# Patient Record
Sex: Female | Born: 1990 | Race: White | Hispanic: No | Marital: Single | Smoking: Never smoker
Health system: Southern US, Community
[De-identification: ages and names within clinical notes are randomized; demographics above are authoritative.]

## PROBLEM LIST (undated history)

## (undated) DIAGNOSIS — R51 Headache: Secondary | ICD-10-CM

## (undated) DIAGNOSIS — R519 Headache, unspecified: Secondary | ICD-10-CM

## (undated) DIAGNOSIS — G932 Benign intracranial hypertension: Secondary | ICD-10-CM

## (undated) HISTORY — PX: WISDOM TOOTH EXTRACTION: SHX21

## (undated) HISTORY — DX: Headache, unspecified: R51.9

## (undated) HISTORY — DX: Benign intracranial hypertension: G93.2

## (undated) HISTORY — DX: Headache: R51

---
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
## 2019-02-02 ENCOUNTER — Telehealth: Payer: Self-pay | Admitting: Family Medicine

## 2019-02-02 NOTE — Telephone Encounter (Signed)
Patient has scheduled a new patient apt via My Chart for 02/04/19 and listed in the apt notes "Dull abdominal pain, swollen lymph node & establishing care." Patient needs to be triaged. Thank you!

## 2019-02-03 NOTE — Telephone Encounter (Signed)
LM for patient to return call for triage.  CRM placed.

## 2019-02-04 ENCOUNTER — Encounter: Payer: Self-pay | Admitting: Family Medicine

## 2019-02-04 ENCOUNTER — Ambulatory Visit (INDEPENDENT_AMBULATORY_CARE_PROVIDER_SITE_OTHER): Payer: No Typology Code available for payment source | Admitting: Family Medicine

## 2019-02-04 ENCOUNTER — Ambulatory Visit (INDEPENDENT_AMBULATORY_CARE_PROVIDER_SITE_OTHER): Payer: No Typology Code available for payment source

## 2019-02-04 ENCOUNTER — Other Ambulatory Visit: Payer: No Typology Code available for payment source

## 2019-02-04 VITALS — BP 124/70 | HR 76 | Temp 98.3°F | Ht 63.0 in | Wt 210.2 lb

## 2019-02-04 DIAGNOSIS — R1032 Left lower quadrant pain: Secondary | ICD-10-CM | POA: Diagnosis not present

## 2019-02-04 DIAGNOSIS — R599 Enlarged lymph nodes, unspecified: Secondary | ICD-10-CM

## 2019-02-04 DIAGNOSIS — D729 Disorder of white blood cells, unspecified: Secondary | ICD-10-CM

## 2019-02-04 DIAGNOSIS — G932 Benign intracranial hypertension: Secondary | ICD-10-CM

## 2019-02-04 DIAGNOSIS — R61 Generalized hyperhidrosis: Secondary | ICD-10-CM

## 2019-02-04 LAB — COMPREHENSIVE METABOLIC PANEL
ALT: 80 U/L — ABNORMAL HIGH (ref 0–35)
AST: 55 U/L — ABNORMAL HIGH (ref 0–37)
Albumin: 4.1 g/dL (ref 3.5–5.2)
Alkaline Phosphatase: 86 U/L (ref 39–117)
BILIRUBIN TOTAL: 0.4 mg/dL (ref 0.2–1.2)
BUN: 12 mg/dL (ref 6–23)
CO2: 24 mEq/L (ref 19–32)
Calcium: 9.2 mg/dL (ref 8.4–10.5)
Chloride: 104 mEq/L (ref 96–112)
Creatinine, Ser: 0.92 mg/dL (ref 0.40–1.20)
GFR: 73.79 mL/min (ref >60.00)
GLUCOSE: 72 mg/dL (ref 70–99)
Potassium: 4 mEq/L (ref 3.5–5.1)
Sodium: 138 mEq/L (ref 135–145)
TOTAL PROTEIN: 6.7 g/dL (ref 6.0–8.3)

## 2019-02-04 LAB — POCT URINE PREGNANCY: PREG TEST UR: NEGATIVE

## 2019-02-04 LAB — CBC WITH DIFFERENTIAL/PLATELET
Basophils Absolute: 0.1 10*3/uL (ref 0.0–0.1)
Basophils Relative: 0.5 % (ref 0.0–3.0)
Eosinophils Absolute: 0 10*3/uL (ref 0.0–0.7)
Eosinophils Relative: 0.3 % (ref 0.0–5.0)
HCT: 38.8 % (ref 36.0–46.0)
Hemoglobin: 12.8 g/dL (ref 12.0–15.0)
Lymphocytes Relative: 66.9 % — ABNORMAL HIGH (ref 12.0–46.0)
Lymphs Abs: 9.1 10*3/uL — ABNORMAL HIGH (ref 0.7–4.0)
MCHC: 33 g/dL (ref 30.0–36.0)
MCV: 78.2 fl (ref 78.0–100.0)
Monocytes Absolute: 1 10*3/uL (ref 0.1–1.0)
Monocytes Relative: 7.3 % (ref 3.0–12.0)
Neutro Abs: 3.4 10*3/uL (ref 1.4–7.7)
Neutrophils Relative %: 25 % — ABNORMAL LOW (ref 43.0–77.0)
PLATELETS: 320 10*3/uL (ref 150.0–400.0)
RBC: 4.96 Mil/uL (ref 3.87–5.11)
RDW: 12.9 % (ref 11.5–15.5)
WBC: 13.6 10*3/uL — ABNORMAL HIGH (ref 4.0–10.5)

## 2019-02-04 LAB — POCT URINALYSIS DIPSTICK
Bilirubin, UA: NEGATIVE
Blood, UA: NEGATIVE
Glucose, UA: NEGATIVE
Ketones, UA: NEGATIVE
Leukocytes, UA: NEGATIVE
Nitrite, UA: NEGATIVE
Protein, UA: NEGATIVE
Spec Grav, UA: >=1.03 — AB (ref 1.010–1.025)
Urobilinogen, UA: 0.2 E.U./dL
pH, UA: 6 (ref 5.0–8.0)

## 2019-02-04 LAB — TSH: TSH: 2.87 u[IU]/mL (ref 0.35–4.50)

## 2019-02-04 MED ORDER — TOPIRAMATE 100 MG PO TABS: 100.0000 mg | ORAL_TABLET | Freq: Two times a day (BID) | ORAL | 0 refills | Status: AC

## 2019-02-04 MED ORDER — ACETAZOLAMIDE 250 MG PO TABS: 250.0000 mg | ORAL_TABLET | Freq: Two times a day (BID) | ORAL | 0 refills | Status: AC

## 2019-02-04 MED FILL — acetaZOLAMIDE 250 MG TABS: 250 | 90 days supply | Qty: 180 | Fill #0

## 2019-02-04 MED FILL — TOPIRAMATE 100 MG TABLET: 100 | 90 days supply | Qty: 180 | Fill #0

## 2019-02-04 NOTE — Progress Notes (Signed)
Chief Complaint:  Katherine Wade is a 28 y.o. female who presents today with a chief complaint of abdominal pain and to establish care.   Assessment/Plan:  Left lower quadrant abdominal pain No red flags.  Benign abdominal exam.  KUB with mild to moderate stool burden based on my read-we will await radiology read.  Will start bowel regimen with MiraLAX as needed to have 1-2 soft bowel movements daily.  If symptoms do not improve the next 2 weeks, will need abdominal CT to rule out other potential etiologies.  Discussed reasons to return to care and seek emergent care.  Lymphadenopathy/night sweats Likely secondary to viral URI.  Will check CBC, CMAC, and TSH.  Continue with watchful waiting for another few weeks.  If lymphadenopathy persists for another few weeks, will obtain ultrasound of the area.  Idiopathic intracranial hypertension No red flags.  Will refill acetazolamide and Topamax today.  Will place referral to neurology.    Subjective:  HPI:  Abdominal Pain Started 2 weeks ago. Stable over that time. Worse with taking a deep breath or coughing. Nothing tried. No fevers or chills. No nausea or vomiting. No constipation or diarrhea. No obvious precipitating events. No other obvious alleviating or aggravating factors.   She has also had night sweats for the past several weeks. Soaking through clothes but not sheets. She has tried changing her thremostat and using less sheets  She has additionally had a swollen lymph node on the right side of her neck for the past week or so.  No fevers or chills.  No recent illnesses.  No clear precipitating events.  Occasionally painful to palpation.   % Idiopathic Intracranial Hypertension She has followed with neurology in the past for this She has previously been on Topamax 100 mg twice daily and acetazolamide 250 mg twice daily, however is been off all medications for the past couple of months since relocating the area.  ROS:  Per HPI, otherwise a complete review of systems was negative.   PMH:  The following were reviewed and entered/updated in epic: Past Medical History:  Diagnosis Date  . Frequent headaches   . Pseudotumor cerebri    Patient Active Problem List   Diagnosis Date Noted  . Idiopathic intracranial hypertension 02/04/2019   Past Surgical History:  Procedure Laterality Date  . Left Thumb Tendon Reattachment  2012  . WISDOM TOOTH EXTRACTION      Family History  Problem Relation Age of Onset  . Diabetes Mother   . Diabetes Father   . Early death Father   . Heart attack Father   . Alcohol abuse Brother   . Depression Brother   . Arthritis Maternal Grandmother   . Cancer Maternal Grandmother        Breast  . COPD Maternal Grandmother   . Diabetes Maternal Grandmother   . Hyperlipidemia Maternal Grandmother   . Cancer Maternal Grandfather        Prostate  . Diabetes Maternal Grandfather   . Kidney disease Maternal Grandfather   . Depression Paternal Grandmother   . Cancer Paternal Grandfather   . COPD Paternal Grandfather   . Mental illness Paternal Grandfather   . Colon cancer Neg Hx     Medications- reviewed and updated Current Outpatient Medications  Medication Sig Dispense Refill  . Multiple Vitamin (MULTIVITAMIN) tablet Take 1 tablet by mouth daily.    Marland Kitchen acetaZOLAMIDE (DIAMOX) 250 MG tablet Take 1 tablet (250 mg total) by mouth 2 (two) times daily.  180 tablet 0  . topiramate (TOPAMAX) 100 MG tablet Take 1 tablet (100 mg total) by mouth 2 (two) times daily. 180 tablet 0   No current facility-administered medications for this visit.     Allergies-reviewed and updated No Known Allergies  Social History   Socioeconomic History  . Marital status: Not on file    Spouse name: Not on file  . Number of children: Not on file  . Years of education: Not on file  . Highest education level: Not on file  Occupational History  . Not on file  Social Needs  . Financial  resource strain: Not on file  . Food insecurity:    Worry: Not on file    Inability: Not on file  . Transportation needs:    Medical: Not on file    Non-medical: Not on file  Tobacco Use  . Smoking status: Never Smoker  . Smokeless tobacco: Never Used  Substance and Sexual Activity  . Alcohol use: Yes  . Drug use: Never  . Sexual activity: Not on file  Lifestyle  . Physical activity:    Days per week: Not on file    Minutes per session: Not on file  . Stress: Not on file  Relationships  . Social connections:    Talks on phone: Not on file    Gets together: Not on file    Attends religious service: Not on file    Active member of club or organization: Not on file    Attends meetings of clubs or organizations: Not on file    Relationship status: Not on file  Other Topics Concern  . Not on file  Social History Narrative  . Not on file         Objective:  Physical Exam: BP 124/70 (BP Location: Left Arm, Patient Position: Sitting, Cuff Size: Normal)   Pulse 76   Temp 98.3 F (36.8 C) (Oral)   Ht 5\' 3"  (1.6 m)   Wt 210 lb 3.2 oz (95.3 kg)   LMP 01/16/2019   SpO2 98%   BMI 37.24 kg/m   Gen: NAD, resting comfortably HEENT: TMs clear.  OP slightly erythematous with mild tonsillar hypertrophy.  Tender nodule noted just anterior and medial to right mandibular angle. CV: Regular rate and rhythm with no murmurs appreciated Pulm: Normal work of breathing, clear to auscultation bilaterally with no crackles, wheezes, or rhonchi GI: Normal bowel sounds present. Soft, mildly tender to palpation in left lower quadrant, Nondistended.  No rebound or guarding. MSK: No edema, cyanosis, or clubbing noted Skin: Warm, dry Neuro: Grossly normal, moves all extremities Psych: Normal affect and thought content  Results for orders placed or performed in visit on 02/04/19 (from the past 24 hour(s))  POCT urinalysis dipstick     Status: Abnormal   Collection Time: 02/04/19 10:12 AM  Result  Value Ref Range   Color, UA yellow    Clarity, UA clear    Glucose, UA Negative Negative   Bilirubin, UA Negative    Ketones, UA Negative    Spec Grav, UA >=1.030 (A) 1.010 - 1.025   Blood, UA Negative    pH, UA 6.0 5.0 - 8.0   Protein, UA Negative Negative   Urobilinogen, UA 0.2 0.2 or 1.0 E.U./dL   Nitrite, UA Negative    Leukocytes, UA Negative Negative   Appearance     Odor    POCT urine pregnancy     Status: None   Collection Time: 02/04/19  10:13 AM  Result Value Ref Range   Preg Test, Ur Negative Negative        Danette Weinfeld M. Jimmey RalphParker, MD 02/04/2019 11:58 AM

## 2019-02-04 NOTE — Telephone Encounter (Signed)
Patient is in the office now.

## 2019-02-04 NOTE — Assessment & Plan Note (Signed)
No red flags.  Will refill acetazolamide and Topamax today.  Will place referral to neurology.

## 2019-02-04 NOTE — Patient Instructions (Signed)
It was very nice to see you today!  I think you are having some constipation that could be causing your symptoms.  Please take MiraLAX as needed to have 1-2 soft movements per day.  Please make sure that you are getting plenty of fluids and eating a diet high in fiber.  Let me know if your symptoms do not improve in the next 1 to 2 weeks and we will get a CT scan of the area.  We will check blood work today to make sure there is nothing else going on.  The lymph node in your neck is likely a reactive lymph node to a mild viral infection.  We will check blood work today.  Please let me know if it is not improved over the next couple weeks and we can get an ultrasound of the area.  I will refill your Topamax and acetazolamide today.  I will also place referral to neurology.  Take care, Dr Jimmey Ralph

## 2019-02-05 LAB — PATHOLOGIST SMEAR REVIEW

## 2019-02-08 ENCOUNTER — Other Ambulatory Visit: Payer: Self-pay | Admitting: Family Medicine

## 2019-02-08 DIAGNOSIS — R945 Abnormal results of liver function studies: Secondary | ICD-10-CM

## 2019-02-08 DIAGNOSIS — R1032 Left lower quadrant pain: Secondary | ICD-10-CM

## 2019-02-08 DIAGNOSIS — R7989 Other specified abnormal findings of blood chemistry: Secondary | ICD-10-CM

## 2019-02-08 NOTE — Progress Notes (Signed)
Please inform patient of the following:  Her liver tests and blood counts were off just a little bit. The pathologist thought that these are most likely a reactive process - nothing to worry about. I would like for her to come back in 2 weeks to recheck.  Please place future order for CMET, CBC with differential, and peripheral smear.  Katina Degree. Jimmey Ralph, MD 02/08/2019 8:51 AM

## 2019-02-08 NOTE — Progress Notes (Signed)
Please inform patient of the following:  Radiology reviewed her xray and agreed with moderate stool burden. Did not see any other abnormalities. Would like for her to proceed with bowel regimen as we discussed.  Katina Degree. Jimmey Ralph, MD 02/08/2019 8:55 AM

## 2019-02-09 ENCOUNTER — Encounter: Payer: Self-pay | Admitting: Family Medicine

## 2019-02-09 ENCOUNTER — Encounter: Payer: Self-pay | Admitting: Neurology

## 2019-02-09 ENCOUNTER — Other Ambulatory Visit: Payer: Self-pay

## 2019-02-09 DIAGNOSIS — F4321 Adjustment disorder with depressed mood: Secondary | ICD-10-CM

## 2019-02-09 DIAGNOSIS — G932 Benign intracranial hypertension: Secondary | ICD-10-CM

## 2019-02-22 NOTE — Progress Notes (Deleted)
NEUROLOGY CONSULTATION NOTE  Katherine Wade MRN: [id number] DOB: [birth year]  Referring provider: Jacquiline Doe, MD Primary care provider: Jacquiline Doe, MD  Reason for consult:  Idiopathic intracranial hypertension  HISTORY OF PRESENT ILLNESS: Katherine Wade is a 28 year old ***-handed Caucasian woman who presents for idiopathic intracranial hypertension.  History supplemented by referring provider's note.  ***.  She had been on topiramate but was off of it for a couple of months after relocating to [place] from *** back in December 2019.  She was restarted on topiramate last month.    CMP from 02/04/19 demonstrated elevated AST 55 and ALT 80 with normal ALP and t bili, thought to be a reactive process.  PAST MEDICAL HISTORY: Past Medical History:  Diagnosis Date  . Frequent headaches   . Pseudotumor cerebri     PAST SURGICAL HISTORY: Past Surgical History:  Procedure Laterality Date  . Left Thumb Tendon Reattachment  2012  . WISDOM TOOTH EXTRACTION      MEDICATIONS: Current Outpatient Medications on File Prior to Visit  Medication Sig Dispense Refill  . acetaZOLAMIDE (DIAMOX) 250 MG tablet Take 1 tablet (250 mg total) by mouth 2 (two) times daily. 180 tablet 0  . Multiple Vitamin (MULTIVITAMIN) tablet Take 1 tablet by mouth daily.    Marland Kitchen topiramate (TOPAMAX) 100 MG tablet Take 1 tablet (100 mg total) by mouth 2 (two) times daily. 180 tablet 0   No current facility-administered medications on file prior to visit.     ALLERGIES: No Known Allergies  FAMILY HISTORY: Family History  Problem Relation Age of Onset  . Diabetes Mother   . Diabetes Father   . Early death Father   . Heart attack Father   . Alcohol abuse Brother   . Depression Brother   . Arthritis Maternal Grandmother   . Cancer Maternal Grandmother        Breast  . COPD Maternal Grandmother   . Diabetes Maternal Grandmother   . Hyperlipidemia Maternal Grandmother   . Cancer Maternal  Grandfather        Prostate  . Diabetes Maternal Grandfather   . Kidney disease Maternal Grandfather   . Depression Paternal Grandmother   . Cancer Paternal Grandfather   . COPD Paternal Grandfather   . Mental illness Paternal Grandfather   . Colon cancer Neg Hx    ***.  SOCIAL HISTORY: Social History   Socioeconomic History  . Marital status: Single    Spouse name: Not on file  . Number of children: Not on file  . Years of education: Not on file  . Highest education level: Not on file  Occupational History  . Not on file  Social Needs  . Financial resource strain: Not on file  . Food insecurity:    Worry: Not on file    Inability: Not on file  . Transportation needs:    Medical: Not on file    Non-medical: Not on file  Tobacco Use  . Smoking status: Never Smoker  . Smokeless tobacco: Never Used  Substance and Sexual Activity  . Alcohol use: Yes  . Drug use: Never  . Sexual activity: Not on file  Lifestyle  . Physical activity:    Days per week: Not on file    Minutes per session: Not on file  . Stress: Not on file  Relationships  . Social connections:    Talks on phone: Not on file    Gets together: Not on file  Attends religious service: Not on file    Active member of club or organization: Not on file    Attends meetings of clubs or organizations: Not on file    Relationship status: Not on file  . Intimate partner violence:    Fear of current or ex partner: Not on file    Emotionally abused: Not on file    Physically abused: Not on file    Forced sexual activity: Not on file  Other Topics Concern  . Not on file  Social History Narrative  . Not on file    REVIEW OF SYSTEMS: Constitutional: No fevers, chills, or sweats, no generalized fatigue, change in appetite Eyes: No visual changes, double vision, eye pain Ear, nose and throat: No hearing loss, ear pain, nasal congestion, sore throat Cardiovascular: No chest pain, palpitations Respiratory:  No  shortness of breath at rest or with exertion, wheezes GastrointestinaI: No nausea, vomiting, diarrhea, abdominal pain, fecal incontinence Genitourinary:  No dysuria, urinary retention or frequency Musculoskeletal:  No neck pain, back pain Integumentary: No rash, pruritus, skin lesions Neurological: as above Psychiatric: No depression, insomnia, anxiety Endocrine: No palpitations, fatigue, diaphoresis, mood swings, change in appetite, change in weight, increased thirst Hematologic/Lymphatic:  No purpura, petechiae. Allergic/Immunologic: no itchy/runny eyes, nasal congestion, recent allergic reactions, rashes  PHYSICAL EXAM: *** General: No acute distress.  Patient appears ***-groomed.  *** Head:  Normocephalic/atraumatic Eyes:  fundi examined but not visualized Neck: supple, no paraspinal tenderness, full range of motion Back: No paraspinal tenderness Heart: regular rate and rhythm Lungs: Clear to auscultation bilaterally. Vascular: No carotid bruits. Neurological Exam: Mental status: alert and oriented to person, place, and time, recent and remote memory intact, fund of knowledge intact, attention and concentration intact, speech fluent and not dysarthric, language intact. Cranial nerves: CN I: not tested CN II: pupils equal, round and reactive to light, visual fields intact CN III, IV, VI:  full range of motion, no nystagmus, no ptosis CN V: facial sensation intact CN VII: upper and lower face symmetric CN VIII: hearing intact CN IX, X: gag intact, uvula midline CN XI: sternocleidomastoid and trapezius muscles intact CN XII: tongue midline Bulk & Tone: normal, no fasciculations. Motor:  5/5 throughout *** Sensation:  Pinprick *** temperature *** and vibration sensation intact.  ***. Deep Tendon Reflexes:  2+ throughout, *** toes downgoing.  *** Finger to nose testing:  Without dysmetria.  *** Heel to shin:  Without dysmetria.  *** Gait:  Normal station and stride.  Able to  turn and tandem walk. Romberg ***.  IMPRESSION: ***  PLAN: ***  Thank you for allowing me to take part in the care of this patient.  Shon Millet, DO  CC: ***

## 2019-02-23 ENCOUNTER — Ambulatory Visit: Payer: No Typology Code available for payment source | Admitting: Neurology

## 2019-02-24 ENCOUNTER — Other Ambulatory Visit: Payer: Self-pay

## 2019-02-24 ENCOUNTER — Other Ambulatory Visit (INDEPENDENT_AMBULATORY_CARE_PROVIDER_SITE_OTHER): Payer: No Typology Code available for payment source

## 2019-02-24 ENCOUNTER — Other Ambulatory Visit: Payer: No Typology Code available for payment source

## 2019-02-24 DIAGNOSIS — R7989 Other specified abnormal findings of blood chemistry: Secondary | ICD-10-CM

## 2019-02-24 DIAGNOSIS — R945 Abnormal results of liver function studies: Secondary | ICD-10-CM

## 2019-02-24 DIAGNOSIS — R1032 Left lower quadrant pain: Secondary | ICD-10-CM

## 2019-02-24 NOTE — Addendum Note (Signed)
Addended by: WARREN-COBBS, Fusaye Wachtel T on: 02/24/2019 02:31 PM   Modules accepted: Orders  

## 2019-02-24 NOTE — Addendum Note (Signed)
Addended by: Young Berry T on: 02/24/2019 02:31 PM   Modules accepted: Orders

## 2019-02-25 LAB — COMPREHENSIVE METABOLIC PANEL
AG Ratio: 1.6 (calc) (ref 1.0–2.5)
ALT: 23 U/L (ref 6–29)
AST: 19 U/L (ref 10–30)
Albumin: 4.1 g/dL (ref 3.6–5.1)
Alkaline phosphatase (APISO): 70 U/L (ref 31–125)
BUN: 16 mg/dL (ref 7–25)
CO2: 19 mmol/L — ABNORMAL LOW (ref 20–32)
Calcium: 9 mg/dL (ref 8.6–10.2)
Chloride: 113 mmol/L — ABNORMAL HIGH (ref 98–110)
Creat: 1.03 mg/dL (ref 0.50–1.10)
GLUCOSE: 77 mg/dL (ref 65–99)
Globulin: 2.6 g/dL (calc) (ref 1.9–3.7)
Potassium: 3.9 mmol/L (ref 3.5–5.3)
Sodium: 138 mmol/L (ref 135–146)
Total Bilirubin: 0.3 mg/dL (ref 0.2–1.2)
Total Protein: 6.7 g/dL (ref 6.1–8.1)

## 2019-02-25 LAB — CBC WITH DIFFERENTIAL/PLATELET
ABSOLUTE MONOCYTES: 608 {cells}/uL (ref 200–950)
BASOS PCT: 1 %
Basophils Absolute: 81 cells/uL (ref 0–200)
EOS ABS: 57 {cells}/uL (ref 15–500)
Eosinophils Relative: 0.7 %
HCT: 39 % (ref 35.0–45.0)
Hemoglobin: 13 g/dL (ref 11.7–15.5)
Lymphs Abs: 3775 cells/uL (ref 850–3900)
MCH: 25.8 pg — AB (ref 27.0–33.0)
MCHC: 33.3 g/dL (ref 32.0–36.0)
MCV: 77.4 fL — ABNORMAL LOW (ref 80.0–100.0)
MPV: 10.9 fL (ref 7.5–12.5)
Monocytes Relative: 7.5 %
Neutro Abs: 3580 cells/uL (ref 1500–7800)
Neutrophils Relative %: 44.2 %
Platelets: 353 10*3/uL (ref 140–400)
RBC: 5.04 10*6/uL (ref 3.80–5.10)
RDW: 13.6 % (ref 11.0–15.0)
Total Lymphocyte: 46.6 %
WBC: 8.1 10*3/uL (ref 3.8–10.8)

## 2019-02-25 LAB — PATHOLOGIST SMEAR REVIEW

## 2019-02-25 NOTE — Progress Notes (Signed)
Please inform patient of the following:  Blood counts and liver tests have normalized. If her symptoms have resolved do not need to do any further testing. Would like for her to come in for OV if still having symptoms.  Katherine Wade. Jimmey Ralph, MD 02/25/2019 9:02 PM

## 2019-03-02 ENCOUNTER — Ambulatory Visit: Payer: No Typology Code available for payment source | Admitting: Psychology

## 2019-03-02 ENCOUNTER — Other Ambulatory Visit: Payer: Self-pay

## 2019-03-02 DIAGNOSIS — F4321 Adjustment disorder with depressed mood: Secondary | ICD-10-CM

## 2019-03-16 ENCOUNTER — Ambulatory Visit (INDEPENDENT_AMBULATORY_CARE_PROVIDER_SITE_OTHER): Payer: No Typology Code available for payment source | Admitting: Psychology

## 2019-03-16 DIAGNOSIS — F4321 Adjustment disorder with depressed mood: Secondary | ICD-10-CM

## 2019-03-30 ENCOUNTER — Ambulatory Visit (INDEPENDENT_AMBULATORY_CARE_PROVIDER_SITE_OTHER): Payer: No Typology Code available for payment source | Admitting: Psychology

## 2019-03-30 DIAGNOSIS — F4321 Adjustment disorder with depressed mood: Secondary | ICD-10-CM

## 2019-04-13 ENCOUNTER — Ambulatory Visit (INDEPENDENT_AMBULATORY_CARE_PROVIDER_SITE_OTHER): Payer: No Typology Code available for payment source | Admitting: Psychology

## 2019-04-13 DIAGNOSIS — F4321 Adjustment disorder with depressed mood: Secondary | ICD-10-CM

## 2019-04-27 ENCOUNTER — Ambulatory Visit (INDEPENDENT_AMBULATORY_CARE_PROVIDER_SITE_OTHER): Payer: No Typology Code available for payment source | Admitting: Psychology

## 2019-04-27 DIAGNOSIS — F4321 Adjustment disorder with depressed mood: Secondary | ICD-10-CM | POA: Diagnosis not present

## 2019-05-06 ENCOUNTER — Ambulatory Visit: Payer: No Typology Code available for payment source | Admitting: Neurology

## 2019-05-11 ENCOUNTER — Encounter: Payer: Self-pay | Admitting: *Deleted

## 2019-05-11 ENCOUNTER — Ambulatory Visit (INDEPENDENT_AMBULATORY_CARE_PROVIDER_SITE_OTHER): Payer: No Typology Code available for payment source | Admitting: Psychology

## 2019-05-11 DIAGNOSIS — F4321 Adjustment disorder with depressed mood: Secondary | ICD-10-CM | POA: Diagnosis not present

## 2019-05-11 NOTE — Progress Notes (Addendum)
Virtual Visit via Video Note The purpose of this virtual visit is to provide medical care while limiting exposure to the novel coronavirus.    Consent was obtained for video visit:  Yes.   Answered questions that patient had about telehealth interaction:  Yes.   I discussed the limitations, risks, security and privacy concerns of performing an evaluation and management service by telemedicine. I also discussed with the patient that there may be a patient responsible charge related to this service. The patient expressed understanding and agreed to proceed.  [name], MD I connected with Katherine Wade at patients initiation/request on 05/12/2019 at 10:30 AM EDT by video enabled telemedicine application and verified that I am speaking with the correct person using two identifiers. Pt MRN:  [id number] Pt DOB:  November 29, 1991 Video Participants:  Katherine Wade   History of Present Illness:  Katherine Wade is a 28 year old woman who presents for idiopathic intracranial hypertension.  History supplemented by referring provider note.  She moved to [place] in January and is establishing care with a neurologist.  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  She had pulsatile tinnitus.  She also reported worsening headaches radiating into the back of her neck.  They were sever and aggravated by exercise which caused dizziness and blurred vision.  No associated nausea, photophobia or phonophobia.  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  She was sent to a neurologist.  As per report of MRI/MRV of head from 02/03/18, there was narrowing of the transverse-sigmoid junction on the right measuring 62% with 13 cm H2O pressure gradient.  She was started on low-dose acetazolamide.  She had an LP which demonstrated an opening pressure of 26 cm water.  Subsequently the acetazolamide was increased and  was started on topiramate to help with the headaches.  Headaches are now not as frequent or severe.  Headaches now occur 1 to 2 times a week, lasting until she takes ibuprofen.  She has headaches since high school.  She still has intermittent pulsatile tinnitus.  She denies visual obscurations.    She currently has an eye exam scheduled with ophthalmology, Dr. Wynell Balloon tomorrow.  Her last eye exam was at least 6 months ago, which demonstrated some residual findings of papilledema but not active.  She currently takes topiramate 100mg  twice daily and acetazolamide 250mg  twice daily.  02/24/19 CMP: NA 138, K3.9, CL 113, CO2 19, glucose 77, BUN 16, CR 1.03, T bili 0.3, ALP 70, AST 19, ALT 23.  Past Medical History: Past Medical History:  Diagnosis Date  . Frequent headaches   . Pseudotumor cerebri     Medications: Outpatient Encounter Medications as of 05/12/2019  Medication Sig  . acetaZOLAMIDE (DIAMOX) 250 MG tablet Take 1 tablet (250 mg total) by mouth 2 (two) times daily.  . Multiple Vitamin (MULTIVITAMIN) tablet Take 1 tablet by mouth daily.  Marland Kitchen topiramate (TOPAMAX) 100 MG tablet Take 1 tablet (100 mg total) by mouth 2 (two) times daily.   No facility-administered encounter medications on file as of 05/12/2019.     Allergies: No Known Allergies  Family History: Family History  Problem Relation Age of Onset  . Diabetes Mother   . Diabetes Father   . Early death Father   . Heart attack Father   . Alcohol abuse Brother   . Depression Brother   . Arthritis Maternal Grandmother   .  Cancer Maternal Grandmother        Breast  . COPD Maternal Grandmother   . Diabetes Maternal Grandmother   . Hyperlipidemia Maternal Grandmother   . Cancer Maternal Grandfather        Prostate  . Diabetes Maternal Grandfather   . Kidney disease Maternal Grandfather   . Depression Paternal Grandmother   . Cancer Paternal Grandfather   . COPD Paternal Grandfather   . Mental illness  Paternal Grandfather   . Colon cancer Neg Hx     Social History: Social History   Socioeconomic History  . Marital status: Single    Spouse name: Not on file  . Number of children: Not on file  . Years of education: Not on file  . Highest education level: Not on file  Occupational History  . Not on file  Social Needs  . Financial resource strain: Not on file  . Food insecurity:    Worry: Not on file    Inability: Not on file  . Transportation needs:    Medical: Not on file    Non-medical: Not on file  Tobacco Use  . Smoking status: Never Smoker  . Smokeless tobacco: Never Used  Substance and Sexual Activity  . Alcohol use: Yes  . Drug use: Never  . Sexual activity: Not on file  Lifestyle  . Physical activity:    Days per week: Not on file    Minutes per session: Not on file  . Stress: Not on file  Relationships  . Social connections:    Talks on phone: Not on file    Gets together: Not on file    Attends religious service: Not on file    Active member of club or organization: Not on file    Attends meetings of clubs or organizations: Not on file    Relationship status: Not on file  . Intimate partner violence:    Fear of current or ex partner: Not on file    Emotionally abused: Not on file    Physically abused: Not on file    Forced sexual activity: Not on file  Other Topics Concern  . Not on file  Social History Narrative  . Not on file   Observations/Objective:   Height 5\' 3"  (1.6 m), weight 210 lb (95.3 kg). No acute distress.  Alert and oriented.  Speech fluent and not dysarthric.  Language intact.  Face symmetric.  Assessment and Plan:   1. Idiopathic intracranial hypertension 2.  Headaches, likely migraine vs tension type  Follow Up Instructions:  1.  Acetazolamide 250mg  twice daily and topiramate 100mg  twice daily.    2.  Follow up with ophthalmology tomorrow.  Will increase acetazolamide if needed.  ADDENDUM:  Eye exam from 05/13/19 demonstrated  no disc edema  3.  Limit use of pain relievers to no more than 2 days out of week to prevent risk of rebound or medication-overuse headache.  4.  Encouraged weight loss/exercise/diet  5.  Try to obtain records from previous neurologist.  6.  Follow up in 6 months.    -I discussed the assessment and treatment plan with the patient. The patient was provided an opportunity to ask questions and all were answered. The patient agreed with the plan and demonstrated an understanding of the instructions.   The patient was advised to call back or seek an in-person evaluation if the symptoms worsen or if the condition fails to improve as anticipated.   Cira ServantAdam Robert , DO

## 2019-05-12 ENCOUNTER — Encounter: Payer: Self-pay | Admitting: *Deleted

## 2019-05-12 ENCOUNTER — Encounter: Payer: Self-pay | Admitting: Neurology

## 2019-05-12 ENCOUNTER — Other Ambulatory Visit: Payer: Self-pay

## 2019-05-12 ENCOUNTER — Telehealth (INDEPENDENT_AMBULATORY_CARE_PROVIDER_SITE_OTHER): Payer: No Typology Code available for payment source | Admitting: Neurology

## 2019-05-12 VITALS — Ht 63.0 in | Wt 210.0 lb

## 2019-05-12 DIAGNOSIS — G932 Benign intracranial hypertension: Secondary | ICD-10-CM | POA: Diagnosis not present

## 2019-05-12 DIAGNOSIS — R519 Headache, unspecified: Secondary | ICD-10-CM

## 2019-05-25 ENCOUNTER — Ambulatory Visit: Payer: No Typology Code available for payment source | Admitting: Psychology

## 2019-05-28 ENCOUNTER — Ambulatory Visit (INDEPENDENT_AMBULATORY_CARE_PROVIDER_SITE_OTHER): Payer: No Typology Code available for payment source | Admitting: Psychology

## 2019-05-28 DIAGNOSIS — F4321 Adjustment disorder with depressed mood: Secondary | ICD-10-CM | POA: Diagnosis not present

## 2019-06-11 ENCOUNTER — Ambulatory Visit (INDEPENDENT_AMBULATORY_CARE_PROVIDER_SITE_OTHER): Payer: No Typology Code available for payment source | Admitting: Psychology

## 2019-06-11 DIAGNOSIS — F4321 Adjustment disorder with depressed mood: Secondary | ICD-10-CM

## 2019-06-15 ENCOUNTER — Encounter: Payer: Self-pay | Admitting: Family Medicine

## 2019-06-28 ENCOUNTER — Ambulatory Visit: Payer: Self-pay | Admitting: Psychology

## 2019-06-29 ENCOUNTER — Encounter: Payer: Self-pay | Admitting: Family Medicine

## 2019-06-29 ENCOUNTER — Ambulatory Visit (INDEPENDENT_AMBULATORY_CARE_PROVIDER_SITE_OTHER): Payer: No Typology Code available for payment source | Admitting: Family Medicine

## 2019-06-29 ENCOUNTER — Other Ambulatory Visit: Payer: Self-pay

## 2019-06-29 VITALS — BP 118/76 | HR 61 | Temp 98.5°F | Ht 63.0 in | Wt 209.8 lb

## 2019-06-29 DIAGNOSIS — Z13220 Encounter for screening for lipoid disorders: Secondary | ICD-10-CM

## 2019-06-29 DIAGNOSIS — K219 Gastro-esophageal reflux disease without esophagitis: Secondary | ICD-10-CM

## 2019-06-29 DIAGNOSIS — G932 Benign intracranial hypertension: Secondary | ICD-10-CM | POA: Diagnosis not present

## 2019-06-29 DIAGNOSIS — Z131 Encounter for screening for diabetes mellitus: Secondary | ICD-10-CM

## 2019-06-29 DIAGNOSIS — Z0001 Encounter for general adult medical examination with abnormal findings: Secondary | ICD-10-CM | POA: Diagnosis not present

## 2019-06-29 DIAGNOSIS — Z6837 Body mass index (BMI) 37.0-37.9, adult: Secondary | ICD-10-CM

## 2019-06-29 DIAGNOSIS — E669 Obesity, unspecified: Secondary | ICD-10-CM

## 2019-06-29 DIAGNOSIS — E7800 Pure hypercholesterolemia, unspecified: Secondary | ICD-10-CM | POA: Insufficient documentation

## 2019-06-29 LAB — LIPID PANEL
Cholesterol: 173 mg/dL (ref 0–200)
HDL: 40.3 mg/dL (ref >39.00)
LDL Cholesterol: 122 mg/dL — ABNORMAL HIGH (ref 0–99)
NonHDL: 133.17
Total CHOL/HDL Ratio: 4
Triglycerides: 55 mg/dL (ref 0.0–149.0)
VLDL: 11 mg/dL (ref 0.0–40.0)

## 2019-06-29 LAB — GLUCOSE, RANDOM: Glucose, Bld: 94 mg/dL (ref 70–99)

## 2019-06-29 NOTE — Progress Notes (Signed)
Chief Complaint:  Katherine StarksKatelind Scarlett Wade is a 28 y.o. female who presents today for her annual comprehensive physical exam.    Assessment/Plan:  Idiopathic intracranial hypertension Stable.  Continue acetazolamide and Topamax per neurology.  GERD (gastroesophageal reflux disease) No red flags.  Recommended Pepcid or Tagamet as needed.   Body mass index is 37.16 kg/m. / Obesity BMI Metric Follow Up - 06/29/19 1327      BMI Metric Follow Up-Please document annually   BMI Metric Follow Up  Education provided        Preventative Healthcare: Check lipid panel and random glucose.  Patient Counseling(The following topics were reviewed and/or handout was given):  -Nutrition: Stressed importance of moderation in sodium/caffeine intake, saturated fat and cholesterol, caloric balance, sufficient intake of fresh fruits, vegetables, and fiber.  -Stressed the importance of regular exercise.   -Substance Abuse: Discussed cessation/primary prevention of tobacco, alcohol, or other drug use; driving or other dangerous activities under the influence; availability of treatment for abuse.   -Injury prevention: Discussed safety belts, safety helmets, smoke detector, smoking near bedding or upholstery.   -Sexuality: Discussed sexually transmitted diseases, partner selection, use of condoms, avoidance of unintended pregnancy and contraceptive alternatives.   -Dental health: Discussed importance of regular tooth brushing, flossing, and dental visits.  -Health maintenance and immunizations reviewed. Please refer to Health maintenance section.  Return to care in 1 year for next preventative visit.     Subjective:  HPI:  She has no acute complaints today.   She has had some issues with reflux for the past several months. Nothing tried. Worse with night shift. No other obvious alleviating or aggravating factors.   Her stable, chronic medical conditions are outlined below:   % Idiopathic  Intracranial Hypertension - Follows with neurology - On Topamax 100 mg twice daily and acetazolamide 250 mg twice daily  Lifestyle Diet: Tries to eat a healthy, balanced diet.  Exercise: Works on cardio 20 minutes per day. Also works on weight lifting.   Depression screen PHQ 2/9 02/04/2019  Decreased Interest 0  Down, Depressed, Hopeless 0  PHQ - 2 Score 0   Health Maintenance Due  Topic Date Due  . HIV Screening  02/07/2008    ROS: Per HPI, otherwise a complete review of systems was negative.   PMH:  The following were reviewed and entered/updated in epic: Past Medical History:  Diagnosis Date  . Frequent headaches   . Pseudotumor cerebri    Patient Active Problem List   Diagnosis Date Noted  . GERD (gastroesophageal reflux disease) 06/29/2019  . Idiopathic intracranial hypertension 02/04/2019   Past Surgical History:  Procedure Laterality Date  . Left Thumb Tendon Reattachment  2012  . WISDOM TOOTH EXTRACTION      Family History  Problem Relation Age of Onset  . Diabetes Mother   . Diabetes Father   . Early death Father   . Heart attack Father   . Alcohol abuse Brother   . Depression Brother   . Arthritis Maternal Grandmother   . Cancer Maternal Grandmother        Breast  . COPD Maternal Grandmother   . Diabetes Maternal Grandmother   . Hyperlipidemia Maternal Grandmother   . Cancer Maternal Grandfather        Prostate  . Diabetes Maternal Grandfather   . Kidney disease Maternal Grandfather   . Depression Paternal Grandmother   . Cancer Paternal Grandfather   . COPD Paternal Grandfather   . Mental illness Paternal  Grandfather   . Colon cancer Neg Hx     Medications- reviewed and updated Current Outpatient Medications  Medication Sig Dispense Refill  . acetaZOLAMIDE (DIAMOX) 250 MG tablet Take 1 tablet (250 mg total) by mouth 2 (two) times daily. 180 tablet 0  . Multiple Vitamin (MULTIVITAMIN) tablet Take 1 tablet by mouth daily.    Marland Kitchen topiramate  (TOPAMAX) 100 MG tablet Take 1 tablet (100 mg total) by mouth 2 (two) times daily. 180 tablet 0   No current facility-administered medications for this visit.     Allergies-reviewed and updated No Known Allergies  Social History   Socioeconomic History  . Marital status: Single    Spouse name: Not on file  . Number of children: Not on file  . Years of education: Not on file  . Highest education level: Not on file  Occupational History  . Not on file  Social Needs  . Financial resource strain: Not on file  . Food insecurity    Worry: Not on file    Inability: Not on file  . Transportation needs    Medical: Not on file    Non-medical: Not on file  Tobacco Use  . Smoking status: Never Smoker  . Smokeless tobacco: Never Used  Substance and Sexual Activity  . Alcohol use: Never    Frequency: Never  . Drug use: Never  . Sexual activity: Not on file  Lifestyle  . Physical activity    Days per week: Not on file    Minutes per session: Not on file  . Stress: Not on file  Relationships  . Social Herbalist on phone: Not on file    Gets together: Not on file    Attends religious service: Not on file    Active member of club or organization: Not on file    Attends meetings of clubs or organizations: Not on file    Relationship status: Not on file  Other Topics Concern  . Not on file  Social History Narrative   Pt is Left handed        Objective:  Physical Exam: BP 118/76 (BP Location: Left Arm, Patient Position: Sitting, Cuff Size: Normal)   Pulse 61   Temp 98.5 F (36.9 C) (Oral)   Ht 5\' 3"  (1.6 m)   Wt 209 lb 12.8 oz (95.2 kg)   SpO2 99%   BMI 37.16 kg/m   Body mass index is 37.16 kg/m. Wt Readings from Last 3 Encounters:  06/29/19 209 lb 12.8 oz (95.2 kg)  05/12/19 210 lb (95.3 kg)  05/12/19 200 lb (90.7 kg)   Gen: NAD, resting comfortably HEENT: TMs normal bilaterally. OP clear. No thyromegaly noted.  CV: RRR with no murmurs appreciated  Pulm: NWOB, CTAB with no crackles, wheezes, or rhonchi GI: Normal bowel sounds present. Soft, Nontender, Nondistended. MSK: no edema, cyanosis, or clubbing noted Skin: warm, dry Neuro: CN2-12 grossly intact. Strength 5/5 in upper and lower extremities. Reflexes symmetric and intact bilaterally.  Psych: Normal affect and thought content     Pailyn Bellevue M. [name], MD 06/29/2019 1:28 PM

## 2019-06-29 NOTE — Progress Notes (Signed)
Please inform patient of the following:  Her bad cholesterol is a bit high but everything else is normal.  Do not restart meds.  Continue working on diet and exercise and we can recheck in a few years.  Katherine Wade. [name], MD 06/29/2019 5:06 PM

## 2019-06-29 NOTE — Assessment & Plan Note (Signed)
Stable.  Continue acetazolamide and Topamax per neurology.

## 2019-06-29 NOTE — Patient Instructions (Signed)
It was very nice to see you today!  You can try taking pepcid or tagamet for your reflux.   We will check blood work today.   Come back in 1 year for your next physical, or sooner if needed.   Take care, Dr [name]  Please try these tips to maintain a healthy lifestyle:   Eat at least 3 REAL meals and 1-2 snacks per day.  Aim for no more than 5 hours between eating.  If you eat breakfast, please do so within one hour of getting up.    Obtain twice as many fruits/vegetables as protein or carbohydrate foods for both lunch and dinner. (Half of each meal should be fruits/vegetables, one quarter protein, and one quarter starchy carbs)   Cut down on sweet beverages. This includes juice, soda, and sweet tea.    Exercise at least 150 minutes every week.    Preventive Care 25-56 Years Old, Female Preventive care refers to visits with your health care provider and lifestyle choices that can promote health and wellness. This includes:  A yearly physical exam. This may also be called an annual well check.  Regular dental visits and eye exams.  Immunizations.  Screening for certain conditions.  Healthy lifestyle choices, such as eating a healthy diet, getting regular exercise, not using drugs or products that contain nicotine and tobacco, and limiting alcohol use. What can I expect for my preventive care visit? Physical exam Your health care provider will check your:  Height and weight. This may be used to calculate body mass index (BMI), which tells if you are at a healthy weight.  Heart rate and blood pressure.  Skin for abnormal spots. Counseling Your health care provider may ask you questions about your:  Alcohol, tobacco, and drug use.  Emotional well-being.  Home and relationship well-being.  Sexual activity.  Eating habits.  Work and work Statistician.  Method of birth control.  Menstrual cycle.  Pregnancy history. What immunizations do I need?  Influenza  (flu) vaccine  This is recommended every year. Tetanus, diphtheria, and pertussis (Tdap) vaccine  You may need a Td booster every 10 years. Varicella (chickenpox) vaccine  You may need this if you have not been vaccinated. Human papillomavirus (HPV) vaccine  If recommended by your health care provider, you may need three doses over 6 months. Measles, mumps, and rubella (MMR) vaccine  You may need at least one dose of MMR. You may also need a second dose. Meningococcal conjugate (MenACWY) vaccine  One dose is recommended if you are age 55-21 years and a first-year college student living in a residence hall, or if you have one of several medical conditions. You may also need additional booster doses. Pneumococcal conjugate (PCV13) vaccine  You may need this if you have certain conditions and were not previously vaccinated. Pneumococcal polysaccharide (PPSV23) vaccine  You may need one or two doses if you smoke cigarettes or if you have certain conditions. Hepatitis A vaccine  You may need this if you have certain conditions or if you travel or work in places where you may be exposed to hepatitis A. Hepatitis B vaccine  You may need this if you have certain conditions or if you travel or work in places where you may be exposed to hepatitis B. Haemophilus influenzae type b (Hib) vaccine  You may need this if you have certain conditions. You may receive vaccines as individual doses or as more than one vaccine together in one shot (combination vaccines).  Talk with your health care provider about the risks and benefits of combination vaccines. What tests do I need?  Blood tests  Lipid and cholesterol levels. These may be checked every 5 years starting at age 48.  Hepatitis C test.  Hepatitis B test. Screening  Diabetes screening. This is done by checking your blood sugar (glucose) after you have not eaten for a while (fasting).  Sexually transmitted disease (STD) testing.   BRCA-related cancer screening. This may be done if you have a family history of breast, ovarian, tubal, or peritoneal cancers.  Pelvic exam and Pap test. This may be done every 3 years starting at age 59. Starting at age 34, this may be done every 5 years if you have a Pap test in combination with an HPV test. Talk with your health care provider about your test results, treatment options, and if necessary, the need for more tests. Follow these instructions at home: Eating and drinking   Eat a diet that includes fresh fruits and vegetables, whole grains, lean protein, and low-fat dairy.  Take vitamin and mineral supplements as recommended by your health care provider.  Do not drink alcohol if: ? Your health care provider tells you not to drink. ? You are pregnant, may be pregnant, or are planning to become pregnant.  If you drink alcohol: ? Limit how much you have to 0-1 drink a day. ? Be aware of how much alcohol is in your drink. In the [place] one drink equals one 12 oz bottle of beer (355 mL), one 5 oz glass of wine (148 mL), or one 1 oz glass of hard liquor (44 mL). Lifestyle  Take daily care of your teeth and gums.  Stay active. Exercise for at least 30 minutes on 5 or more days each week.  Do not use any products that contain nicotine or tobacco, such as cigarettes, e-cigarettes, and chewing tobacco. If you need help quitting, ask your health care provider.  If you are sexually active, practice safe sex. Use a condom or other form of birth control (contraception) in order to prevent pregnancy and STIs (sexually transmitted infections). If you plan to become pregnant, see your health care provider for a preconception visit. What's next?  Visit your health care provider once a year for a well check visit.  Ask your health care provider how often you should have your eyes and teeth checked.  Stay up to date on all vaccines. This information is not intended to replace advice given  to you by your health care provider. Make sure you discuss any questions you have with your health care provider. Document Released: 01/28/2002 Document Revised: 08/13/2018 Document Reviewed: 08/13/2018 Elsevier Patient Education  2020 [employer].

## 2019-06-29 NOTE — Assessment & Plan Note (Signed)
No red flags.  Recommended Pepcid or Tagamet as needed.

## 2019-07-05 ENCOUNTER — Encounter: Payer: Self-pay | Admitting: Family Medicine

## 2019-07-23 ENCOUNTER — Encounter: Payer: Self-pay | Admitting: Family Medicine

## 2019-08-27 ENCOUNTER — Telehealth: Payer: No Typology Code available for payment source

## 2019-08-27 ENCOUNTER — Ambulatory Visit (INDEPENDENT_AMBULATORY_CARE_PROVIDER_SITE_OTHER)
Admission: RE | Admit: 2019-08-27 | Discharge: 2019-08-27 | Disposition: A | Discharge status: Home / Self Care | Payer: No Typology Code available for payment source | Source: Ambulatory Visit

## 2019-08-27 DIAGNOSIS — Y93B3 Activity, free weights: Secondary | ICD-10-CM

## 2019-08-27 DIAGNOSIS — Y93B9 Activity, other involving muscle strengthening exercises: Secondary | ICD-10-CM

## 2019-08-27 DIAGNOSIS — M25552 Pain in left hip: Secondary | ICD-10-CM

## 2019-08-27 DIAGNOSIS — S76012A Strain of muscle, fascia and tendon of left hip, initial encounter: Secondary | ICD-10-CM | POA: Diagnosis not present

## 2019-08-27 MED ORDER — CYCLOBENZAPRINE HCL 5 MG PO TABS: 5.0000 mg | ORAL_TABLET | Freq: Three times a day (TID) | ORAL | 0 refills | Status: AC | PRN

## 2019-08-27 MED ORDER — NAPROXEN 500 MG PO TABS: 500.0000 mg | ORAL_TABLET | Freq: Two times a day (BID) | ORAL | 0 refills | Status: AC

## 2019-08-27 NOTE — ED Provider Notes (Signed)
Virtual Visit via Video Note:  Katherine Wade  initiated request for Telemedicine visit with [facility] team. I connected with Katherine Wade  on 08/27/2019 at 6:06 PM  for a synchronized telemedicine visit using a video enabled HIPPA compliant telemedicine application. I verified that I am speaking with Katherine Wade  using two identifiers. Jaynee Eagles, PA-C  was physically located in a [facility]gent [facility] site and [place] was located at a different location.   The limitations of evaluation and management by telemedicine as well as the availability of in-person appointments were discussed. Patient was informed that she  may incur a bill ( including co-pay) for this virtual visit encounter. [place]  expressed understanding and gave verbal consent to proceed with virtual visit.   History of Present Illness:Katherine Wade  is a 28 y.o. female presents with acute onset of left hip pain this morning while doing squats using free weights. She has since had persistent sharp pain, difficulty bearing weight. Has tried TENS unit, icing without any relief.    ROS Denies fever, n/v, bruising, ecchymosis, redness, warmth, weakness, incontinence, inability to urinate or defecate.    No current facility-administered medications for this encounter.    Current Outpatient Medications  Medication Sig Dispense Refill  . acetaZOLAMIDE (DIAMOX) 250 MG tablet Take 1 tablet (250 mg total) by mouth 2 (two) times daily. 180 tablet 0  . Multiple Vitamin (MULTIVITAMIN) tablet Take 1 tablet by mouth daily.    Marland Kitchen topiramate (TOPAMAX) 100 MG tablet Take 1 tablet (100 mg total) by mouth 2 (two) times daily. 180 tablet 0     No Known Allergies    Past Medical History:  Diagnosis Date  . Frequent headaches   . Pseudotumor cerebri     Past Surgical History:  Procedure Laterality Date  . Left Thumb Tendon Reattachment   2012  . WISDOM TOOTH EXTRACTION      Observations/Objective: Physical Exam Constitutional:      General: She is not in acute distress.    Appearance: Normal appearance. She is well-developed. She is not ill-appearing, toxic-appearing or diaphoretic.  Eyes:     Extraocular Movements: Extraocular movements intact.  Pulmonary:     Effort: Pulmonary effort is normal.  Neurological:     General: No focal deficit present.     Mental Status: She is alert and oriented to person, place, and time.  Psychiatric:        Mood and Affect: Mood normal.        Behavior: Behavior normal.        Thought Content: Thought content normal.        Judgment: Judgment normal.     Assessment and Plan:  1. Left hip pain   2. Hip strain, left, initial encounter    We will manage conservatively for musculoskeletal type pain associated with a hip strain.  Counseled on use of NSAID, muscle relaxant and modification of physical activity and exercise.  Anticipatory guidance provided.  Counseled patient on potential for adverse effects with medications prescribed/recommended today, ER and return-to-clinic precautions discussed, patient verbalized understanding.    Follow Up Instructions:    I discussed the assessment and treatment plan with the patient. The patient was provided an opportunity to ask questions and all were answered. The patient agreed with the plan and demonstrated an understanding of the instructions.   The patient was advised to call back or seek an in-person evaluation if  the symptoms worsen or if the condition fails to improve as anticipated.  I provided 15 minutes of non-face-to-face time during this encounter.    Wallis BambergMario Savier Trickett, PA-C  08/27/2019 6:06 PM       Wallis BambergMani, Lalah Durango, PA-C 08/27/19 1817

## 2019-10-28 ENCOUNTER — Ambulatory Visit (INDEPENDENT_AMBULATORY_CARE_PROVIDER_SITE_OTHER): Payer: No Typology Code available for payment source

## 2019-10-28 ENCOUNTER — Other Ambulatory Visit: Payer: Self-pay

## 2019-10-28 ENCOUNTER — Ambulatory Visit (INDEPENDENT_AMBULATORY_CARE_PROVIDER_SITE_OTHER): Payer: No Typology Code available for payment source | Admitting: Family Medicine

## 2019-10-28 ENCOUNTER — Encounter: Payer: Self-pay | Admitting: Family Medicine

## 2019-10-28 VITALS — BP 130/82 | HR 62 | Ht 63.0 in | Wt 220.4 lb

## 2019-10-28 DIAGNOSIS — M545 Low back pain, unspecified: Secondary | ICD-10-CM

## 2019-10-28 NOTE — Patient Instructions (Signed)
Thank you for coming in today. Attend PT.  Try using heating pad.  Continue TENS unit.  Recheck in 4 weeks.  Return sooner if needed.    Come back or go to the emergency room if you notice new weakness new numbness problems walking or bowel or bladder problems.

## 2019-10-28 NOTE — Progress Notes (Signed)
X-ray lumbar spine shows arthritis at L5-S1 like we discussed.  No other significant changes present.

## 2019-10-28 NOTE — Progress Notes (Signed)
Subjective:    I'm seeing this patient as a consultation for:  Dr. Jacquiline Doe  CC: Low back pain  I, Christoper Fabian, LAT, ATC, am serving as scribe for Dr. Clementeen Graham.  HPI: Pt is a 28 y/o female presenting w/ c/o acute on chronic low back pain that worsened over the past week.  She states that typically her low back pain worsens when she does squats at the gym but she hasn't done squats over the past week.  Her pain is midline low back pain that radiates up and down her spine.  She notes that occasionally she has pain in her left leg radiating down her leg to the lateral calf however not painful today.  She denies significant weakness recently.  No numbness bowel bladder dysfunction.  .  She rates her pain at rest as a sharp 1/10 and a 7-8/10 at it's worst.  She denies any paresthesias into her B LE.  Aggravating factors include all motions of her back, laying supine, transitioning from supine to sit, coughing/sneezing.  She has tried TENS, cool roll-on topical w/ no real relief.  Past medical history, Surgical history, Family history not pertinant except as noted below, Social history, Allergies, and medications have been entered into the medical record, reviewed, and no changes needed.   Review of Systems: No headache, visual changes, nausea, vomiting, diarrhea, constipation, dizziness, abdominal pain, skin rash, fevers, chills, night sweats, weight loss, swollen lymph nodes, body aches, joint swelling, muscle aches, chest pain, shortness of breath, mood changes, visual or auditory hallucinations.   Objective:    Vitals:   10/28/19 0950  BP: 130/82  Pulse: 62  SpO2: 99%   General: Well Developed, well nourished, and in no acute distress.  Neuro/Psych: Alert and oriented x3, extra-ocular muscles intact, able to move all 4 extremities, sensation grossly intact. Skin: Warm and dry, no rashes noted.  Respiratory: Not using accessory muscles, speaking in full sentences, trachea midline.   Cardiovascular: Pulses palpable, no extremity edema. Abdomen: Does not appear distended. MSK:  L-spine: Nontender to spinal midline. Range of motion normal flexion extension rotation lateral flexion.  Pain with extension present. Lower extremity strength equal normal throughout bilateral extremities. Reflexes equal bilateral lower extremities. Sensation intact throughout bilateral lower extremities. Normal gait.  Lab and Radiology Results X-ray images L-spine obtained today personally and independently reviewed DDD and facet DJD at L5-S1.  Otherwise normal-appearing with no malalignment significant degenerative changes fractures or lytic lesions. Await formal radiology review  Impression and Recommendations:    Assessment and Plan: 28 y.o. female with acute on chronic low back pain.  Patient does have some degenerative changes at L5-S1 level.  However pain today is most likely due to myofascial strain and spasm.  Plan to proceed with trial of physical therapy along with heating pad and TENS unit.  Check back in about 4 weeks.  Return sooner if needed.Marland Kitchen  PDMP not reviewed this encounter. Orders Placed This Encounter  Procedures  . DG Lumbar Spine Complete    Standing Status:   Future    Number of Occurrences:   1    Standing Expiration Date:   12/27/2020    Order Specific Question:   Reason for Exam (SYMPTOM  OR DIAGNOSIS REQUIRED)    Answer:   eval acute on chronic LBP    Order Specific Question:   Is patient pregnant?    Answer:   No    Order Specific Question:   Preferred imaging location?  Answer:   [facility]    Order Specific Question:   Radiology Contrast Protocol - do NOT remove file path    Answer:   [email or web address]  . Ambulatory referral to Physical Therapy    Referral Priority:   Routine    Referral Type:   Physical Medicine    Referral Reason:   Specialty Services Required    Requested Specialty:   Physical  Therapy   No orders of the defined types were placed in this encounter.   Discussed warning signs or symptoms. Please see discharge instructions. Patient expresses understanding.  The above documentation has been reviewed and is accurate and complete [place] Leader

## 2019-11-01 ENCOUNTER — Encounter: Payer: Self-pay | Admitting: Physical Therapy

## 2019-11-01 ENCOUNTER — Ambulatory Visit: Payer: No Typology Code available for payment source | Admitting: Physical Therapy

## 2019-11-01 ENCOUNTER — Other Ambulatory Visit: Payer: Self-pay

## 2019-11-01 DIAGNOSIS — G8929 Other chronic pain: Secondary | ICD-10-CM | POA: Diagnosis not present

## 2019-11-01 DIAGNOSIS — M6281 Muscle weakness (generalized): Secondary | ICD-10-CM

## 2019-11-01 DIAGNOSIS — M545 Low back pain: Secondary | ICD-10-CM | POA: Diagnosis not present

## 2019-11-01 NOTE — Patient Instructions (Signed)
Access Code: [email or web address]  URL: [email or web address]  Date: 11/01/2019  Prepared by: Lyndee Hensen   Exercises Supine Transversus Abdominis Bracing - Hands on Stomach - 10 reps - 5 hold - 2x daily Supine March - 10 reps - 2 sets - 2x daily Hooklying Clamshell with Resistance - 10 reps - 2 sets - 2x daily Supine Bridge - 10 reps - 2 sets - 2x daily Squat with Chair Touch - 10 reps - 2 sets - 2x daily Straight Leg Raise - 10 reps - 2 sets - 2x daily Single Knee to Chest Stretch - 3 reps - 30 hold - 2x daily Seated Lumbar Flexion Stretch - 3 reps - 30 hold - 2x daily Supine Piriformis Stretch Pulling Heel to Hip - 3 reps - 30 hold - 2x daily Seated Piriformis Stretch with Trunk Bend - 3 reps - 30 hold - 2x daily

## 2019-11-01 NOTE — Therapy (Signed)
[facility] [address] [place], [place], [address] Phone: [phone]   Fax:  [phone]  Physical Therapy Evaluation  Patient Details  Name: Katherine Wade MRN: [id number] Date of Birth: 1991-09-25 Referring Provider (PT): Lynne Leader   Encounter Date: 11/01/2019  PT End of Session - 11/01/19 0940    Visit Number  1    Number of Visits  12    Date for PT Re-Evaluation  12/13/19    Authorization Type  [facility] [employer]    PT Start Time  0845    PT Stop Time  0928    PT Time Calculation (min)  43 min    Activity Tolerance  Patient tolerated treatment well    Behavior During Therapy  [facility] for tasks assessed/performed       Past Medical History:  Diagnosis Date  . Frequent headaches   . Pseudotumor cerebri     Past Surgical History:  Procedure Laterality Date  . Left Thumb Tendon Reattachment  2012  . WISDOM TOOTH EXTRACTION      There were no vitals filed for this visit.   Subjective Assessment - 11/01/19 0846    Subjective  Pt states long history of back pain, usually has resolved itself. Pt works 12 hour shifts (night) at [employer], much pain at end of shift. She has increased pain with standing and work duties, better on days she is off.  Likes to do squats for exercise but has increased pain. Pt states no HEP for her back at this time.    Limitations  Lifting;Sitting;Standing;Writing;House hold activities;Walking    Patient Stated Goals  decreased pain    Currently in Pain?  Yes    Pain Score  4     Pain Location  Back    Pain Orientation  Right;Left;Lower    Pain Descriptors / Indicators  Aching;Sharp    Pain Type  Chronic pain    Pain Radiating Towards  Pain increased to 6-7/10 after work.    Pain Onset  More than a month ago    Pain Frequency  Intermittent    Aggravating Factors   standing, bending, laying flat, work duties,  "everything painful" at end of shift    Pain Relieving Factors  heat, tens          [facility] PT Assessment - 11/01/19 0001      Assessment   Medical Diagnosis  Back Pain    Referring Provider (PT)  Lynne Leader    Hand Dominance  Left    Next MD Visit  4 weeks    Prior Therapy  No      Balance Screen   Has the patient fallen in the past 6 months  No      Prior Function   Level of Independence  Independent      Cognition   Overall Cognitive Status  Within Functional Limits for tasks assessed      Posture/Postural Control   Posture Comments  Standing: increased lumbar lordosis,   Poor squat mehcanics, with increasedl lordosis       ROM / Strength   AROM / PROM / Strength  AROM;Strength      AROM   Overall AROM Comments  Lumbar: WNL,  Hips: WNL      Strength   Overall Strength Comments  Hips: 4+/5,  Knee: 5/5,  Core: 3-/5 (signficiant postural changes seen with core stability)       Palpation   Palpation comment  Tenderness in central lumbar region, L3-L5.  Mild tightness and tenderness in bil Paraspinals.  No pain in SI or glute today.       Special Tests   Other special tests  Neg SLR.                 Objective measurements completed on examination: See above findings.      [facility] Adult PT Treatment/Exercise - 11/01/19 0001      Exercises   Exercises  Lumbar      Lumbar Exercises: Stretches   Single Knee to Chest Stretch  2 reps;30 seconds;Right;Left    Piriformis Stretch  2 reps;30 seconds    Piriformis Stretch Limitations  supine      Lumbar Exercises: Supine   Ab Set  10 reps;5 seconds    Clam  20 reps    Clam Limitations  GTB and TA    Bent Knee Raise  20 reps    Bent Knee Raise Limitations  with TA    Bridge  20 reps    Straight Leg Raise  10 reps    Straight Leg Raises Limitations  with TA             PT Education - 11/01/19 0630    Education Details  PT POC, Exam findings, Posutre, Initial HEP :   Medbridge:  [id number]    Person(s) Educated  Patient    Methods  Explanation;Demonstration;Tactile cues;Verbal  cues;Handout    Comprehension  Verbalized understanding;Returned demonstration;Verbal cues required;Tactile cues required;Need further instruction       PT Short Term Goals - 11/01/19 0941      PT SHORT TERM GOAL #1   Title  Pt to be independent with initial HEP    Time  2    Period  Weeks    Status  New    Target Date  11/15/19      PT SHORT TERM GOAL #2   Title  Pt to report decreased pain in low back to 5/10 at end of work day    Time  3    Period  Weeks    Status  New    Target Date  11/22/19        PT Long Term Goals - 11/01/19 0942      PT LONG TERM GOAL #1   Title  Pt to be independent with final HEP    Time  6    Period  Weeks    Status  New    Target Date  12/13/19      PT LONG TERM GOAL #2   Title  Pt to report decreased pain to 0-2/10 with work duties and IADLs.    Time  6    Period  Weeks    Status  New    Target Date  12/13/19      PT LONG TERM GOAL #3   Title  Pt to demo improved core strength to have only -minimal- postural changes with core stability exercises    Time  6    Period  Weeks    Status  New    Target Date  12/13/19      PT LONG TERM GOAL #4   Title  Pt to demo correct posture and mechanics for lift/squat, for improved safety  with work duties and exercise.    Time  6    Period  Weeks    Status  New    Target Date  12/13/19  Plan - 11/01/19 0945    Clinical Impression Statement  Pt presents with primary complaint of increased pain in low back, increased with standing, bending and work duties as Engineer, civil (consulting)nurse. She has good ROM, and good LE strength, but has poor core strength and stability. She has lack of effective HEP for back pain, and has had onging pain for years .Pt to benefit from education on core strength, as well as mechanics for ADLs, IADLs and squat/work duties. Pt with mild pain today at central lumbar region, and mild tightness in lumbar musculature, but decreased from previous days when she is working. Pt with  decreased ability for full functional activiites due to pain. Pt to benefit from skilled PT to improve. Pt lives an hour away, would like to go to PT clinic closer to her work.    Personal Factors and Comorbidities  Fitness;Past/Current Experience    Examination-Activity Limitations  Bend;Caring for Others;Carry;Squat;Stand;Lift    Examination-Participation Restrictions  Cleaning;Community Activity;Driving;Shop;Laundry    Stability/Clinical Decision Making  Stable/Uncomplicated    Clinical Decision Making  Low    Rehab Potential  Good    PT Frequency  2x / week    PT Duration  6 weeks    PT Treatment/Interventions  ADLs/Self Care Home Management;Cryotherapy;Electrical Stimulation;DME Instruction;Ultrasound;Traction;Moist Heat;Iontophoresis 4mg /ml Dexamethasone;Gait training;Stair training;Functional mobility training;Therapeutic activities;Therapeutic exercise;Balance training;Neuromuscular re-education;Manual techniques;Patient/family education;Passive range of motion;Dry needling;Energy conservation;Taping;Spinal Manipulations;Joint Manipulations    PT Next Visit Plan  Review mechanics/ther ex for HEP, progress core stability,  Continue education on squat mechanics, core activation for work duties.    PT Home Exercise Plan  [id number] Medbridge    Consulted and Agree with Plan of Care  Patient       Patient will benefit from skilled therapeutic intervention in order to improve the following deficits and impairments:  Increased muscle spasms, Decreased endurance, Decreased activity tolerance, Pain, Improper body mechanics, Decreased strength  Visit Diagnosis: Chronic bilateral low back pain without sciatica  Muscle weakness (generalized)     Problem List Patient Active Problem List   Diagnosis Date Noted  . GERD (gastroesophageal reflux disease) 06/29/2019  . Elevated LDL cholesterol level 06/29/2019  . Idiopathic intracranial hypertension 02/04/2019    Sedalia MutaLauren Pauleen Goleman, PT, DPT 9:57  AM  11/01/19    [facility] [address] [address] [place], [place], [address] Phone: [phone]   Fax:  [phone]  Name: Katherine Wade MRN: [id number] Date of Birth: Jul 19, 1991

## 2019-11-04 ENCOUNTER — Ambulatory Visit: Payer: No Typology Code available for payment source | Admitting: Family Medicine

## 2019-11-14 NOTE — Progress Notes (Deleted)
Virtual Visit via Video Note The purpose of this virtual visit is to provide medical care while limiting exposure to the novel coronavirus.    Consent was obtained for video visit:  Yes Answered questions that patient had about telehealth interaction:  Yes I discussed the limitations, risks, security and privacy concerns of performing an evaluation and management service by telemedicine. I also discussed with the patient that there may be a patient responsible charge related to this service. The patient expressed understanding and agreed to proceed.  Pt location: Home Physician Location: office Name of referring provider:  Ardith DarkParker, Caleb M, MD I connected with Marlou StarksKatelind Scarlett Hoose at patients initiation/request on 11/15/2019 at 10:10 AM EST by video enabled telemedicine application and verified that I am speaking with the correct person using two identifiers. Pt MRN:  [id number] Pt DOB:  1991-12-04 Video Participants:  Marlou StarksKatelind Scarlett Kuchenbecker   History of Present Illness:  Katherine Wade Narain is a 28 year old woman who follows up for idiopathic intracranial hypertension.   UPDATE: Current medication:  Acetazolamide 250mg  twice daily; topiramate 100mg  twice daily  Formal eye exam by Dr. Alben SpittleWeaver on 05/13/2019 revealed no papilledema.  HISTORY: [place] in January 2020 and is establishing care with a neurologist.  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  She had pulsatile tinnitus.  She also reported worsening headaches radiating into the back of her neck.  They were sever and aggravated by exercise which caused dizziness and blurred vision.  No associated nausea, photophobia or phonophobia.  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  She was sent to a neurologist.  As per report of MRI/MRV of head from 02/03/18, there was narrowing of the transverse-sigmoid junction on the right measuring 62% with 13 cm H2O pressure gradient.  She was started on low-dose  acetazolamide.  She had an LP which demonstrated an opening pressure of 26 cm water.  Subsequently the acetazolamide was increased and was started on topiramate to help with the headaches.  Headaches are now not as frequent or severe.  Headaches now occur 1 to 2 times a week, lasting until she takes ibuprofen.  She has headaches since high school.  She still has intermittent pulsatile tinnitus.  She denies visual obscurations.     Past Medical History: Past Medical History:  Diagnosis Date  . Frequent headaches   . Pseudotumor cerebri     Medications: Outpatient Encounter Medications as of 11/15/2019  Medication Sig  . acetaZOLAMIDE (DIAMOX) 250 MG tablet Take 1 tablet (250 mg total) by mouth 2 (two) times daily. (Patient not taking: Reported on 10/28/2019)  . cyclobenzaprine (FLEXERIL) 5 MG tablet Take 1 tablet (5 mg total) by mouth 3 (three) times daily as needed for muscle spasms. (Patient not taking: Reported on 10/28/2019)  . Multiple Vitamin (MULTIVITAMIN) tablet Take 1 tablet by mouth daily.  . naproxen (NAPROSYN) 500 MG tablet Take 1 tablet (500 mg total) by mouth 2 (two) times daily. (Patient not taking: Reported on 10/28/2019)  . topiramate (TOPAMAX) 100 MG tablet Take 1 tablet (100 mg total) by mouth 2 (two) times daily. (Patient not taking: Reported on 10/28/2019)   No facility-administered encounter medications on file as of 11/15/2019.     Allergies: No Known Allergies  Family History: Family History  Problem Relation Age of Onset  . Diabetes Mother   . Diabetes Father   . Early death Father   . Heart attack Father   . Alcohol abuse Brother   .  Depression Brother   . Arthritis Maternal Grandmother   . Cancer Maternal Grandmother        Breast  . COPD Maternal Grandmother   . Diabetes Maternal Grandmother   . Hyperlipidemia Maternal Grandmother   . Cancer Maternal Grandfather        Prostate  . Diabetes Maternal Grandfather   . Kidney disease Maternal  Grandfather   . Depression Paternal Grandmother   . Cancer Paternal Grandfather   . COPD Paternal Grandfather   . Mental illness Paternal Grandfather   . Colon cancer Neg Hx     Social History: Social History   Socioeconomic History  . Marital status: Single    Spouse name: Not on file  . Number of children: Not on file  . Years of education: Not on file  . Highest education level: Not on file  Occupational History  . Not on file  Social Needs  . Financial resource strain: Not on file  . Food insecurity    Worry: Not on file    Inability: Not on file  . Transportation needs    Medical: Not on file    Non-medical: Not on file  Tobacco Use  . Smoking status: Never Smoker  . Smokeless tobacco: Never Used  Substance and Sexual Activity  . Alcohol use: Never    Frequency: Never  . Drug use: Never  . Sexual activity: Not on file  Lifestyle  . Physical activity    Days per week: Not on file    Minutes per session: Not on file  . Stress: Not on file  Relationships  . Social Herbalist on phone: Not on file    Gets together: Not on file    Attends religious service: Not on file    Active member of club or organization: Not on file    Attends meetings of clubs or organizations: Not on file    Relationship status: Not on file  . Intimate partner violence    Fear of current or ex partner: Not on file    Emotionally abused: Not on file    Physically abused: Not on file    Forced sexual activity: Not on file  Other Topics Concern  . Not on file  Social History Narrative   Pt is Left handed    Observations/Objective:   *** No acute distress.  Alert and oriented.  Speech fluent and not dysarthric.  Language intact.  Eyes orthophoric on primary gaze.  Face symmetric.  Assessment and Plan:   1.  Idiopathic intracranial hypertension 2.  Headaches, likely migraine vs tension-type  1.  Discontinue acetazolamide.  Recommend repeat eye exam in 6 to 8 weeks.   2.  Continue topiramate 100mg  twice daily for now (headache preventative) 3.  For abortive therapy, *** 4.  Limit use of pain relievers to no more than 2 days out of week to prevent risk of rebound or medication-overuse headache. 5.  Keep headache diary 6.  Exercise, hydration, caffeine cessation, sleep hygiene, monitor for and avoid triggers 7.  Consider:  magnesium citrate 400mg  daily, riboflavin 400mg  daily, and coenzyme Q10 100mg  three times daily 8. Follow up ***   Follow Up Instructions:    -I discussed the assessment and treatment plan with the patient. The patient was provided an opportunity to ask questions and all were answered. The patient agreed with the plan and demonstrated an understanding of the instructions.   The patient was advised to call back  or seek an in-person evaluation if the symptoms worsen or if the condition fails to improve as anticipated.    Total Time spent in visit with the patient was:  ***   Cira Servant, DO

## 2019-11-15 ENCOUNTER — Telehealth: Payer: No Typology Code available for payment source | Admitting: Neurology

## 2019-11-15 ENCOUNTER — Telehealth: Payer: Self-pay

## 2019-11-15 ENCOUNTER — Ambulatory Visit: Payer: No Typology Code available for payment source

## 2019-11-15 ENCOUNTER — Other Ambulatory Visit: Payer: Self-pay

## 2019-11-15 NOTE — Telephone Encounter (Signed)
Called patient several times today regarding her appt. no answer no return call. I also have left several messages regarding her appt today.  If patient has not called back before 10:25 am she will be a no show.

## 2019-11-16 NOTE — Telephone Encounter (Signed)
Noted  

## 2019-11-17 ENCOUNTER — Ambulatory Visit: Payer: No Typology Code available for payment source | Admitting: Physical Therapy

## 2019-11-24 ENCOUNTER — Ambulatory Visit: Payer: No Typology Code available for payment source | Admitting: Family Medicine

## 2020-02-20 IMAGING — DX DG ABDOMEN 1V
1 series · 1 of 1 positions shown · non-contrast
Comparison: None.

CLINICAL DATA: One week history of abdominal pain

EXAM:
ABDOMEN - 1 VIEW

[kub ap]
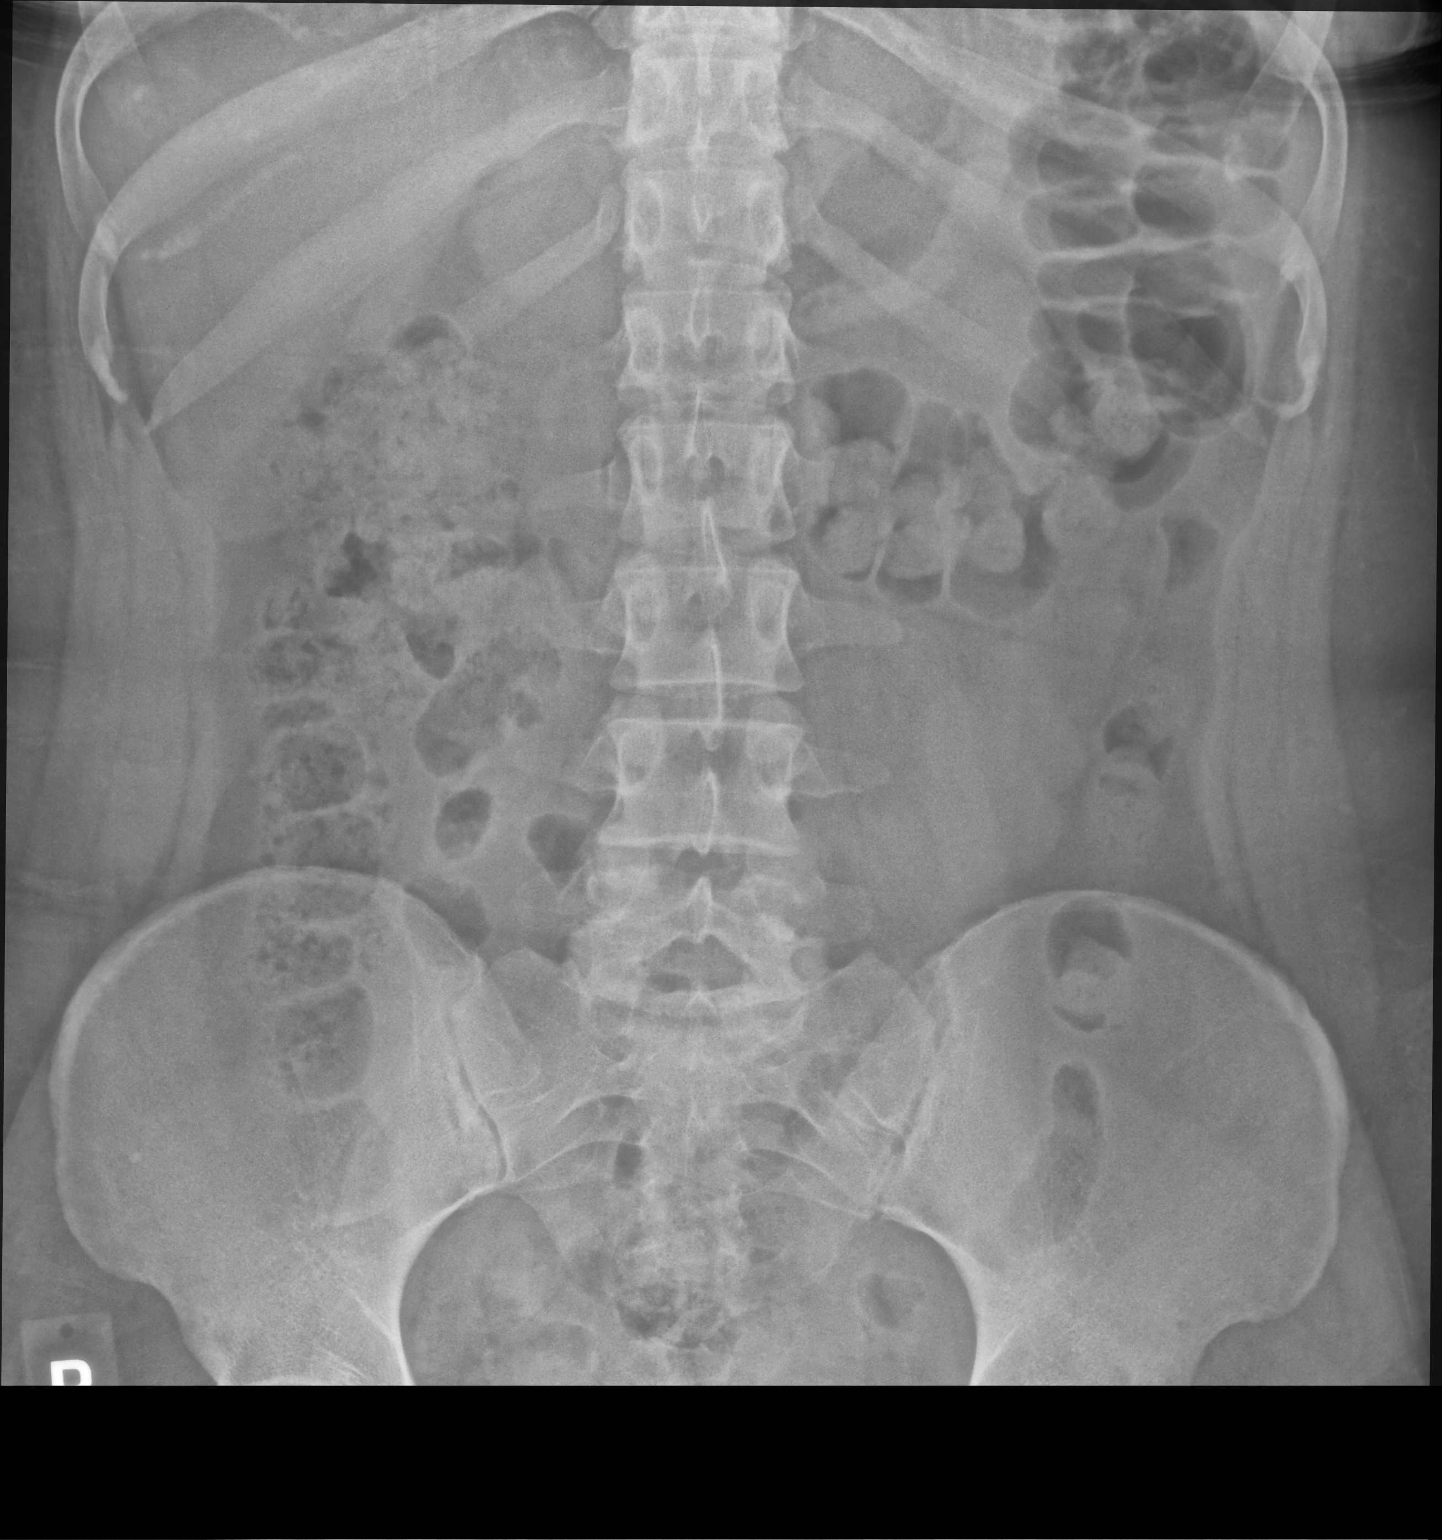

[1 of 1 positions shown; findings below may reference images not displayed]

FINDINGS: There is moderate stool in the colon. There is no bowel dilatation
or air-fluid level to suggest bowel obstruction. No free air. There
are small calcifications in the pelvis, likely phleboliths.
IMPRESSION: No bowel obstruction or free air evident on this supine examination.
Moderate stool in colon.

## 2020-11-12 IMAGING — DX DG LUMBAR SPINE COMPLETE 4+V
5 series · 5 of 5 positions shown · non-contrast
Comparison: None.

CLINICAL DATA: Acute on chronic low back pain without sciatica.

EXAM:
LUMBAR SPINE - COMPLETE 4+ VIEW

[lumbar spine ap]
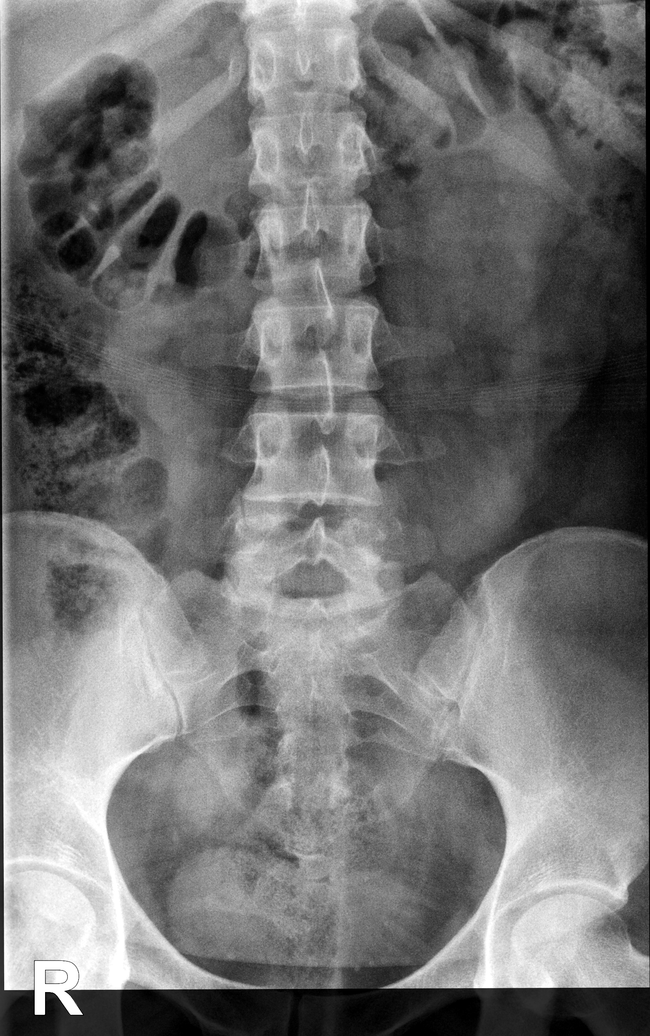

[lumbar spine oblique (1 of 2)]
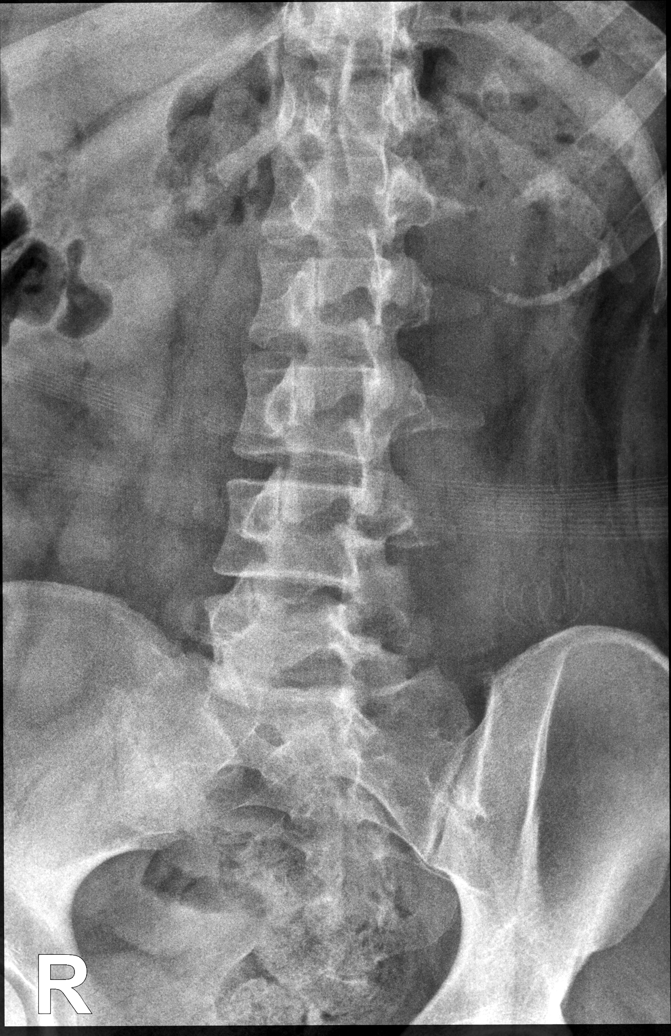

[lumbar spine oblique (2 of 2)]
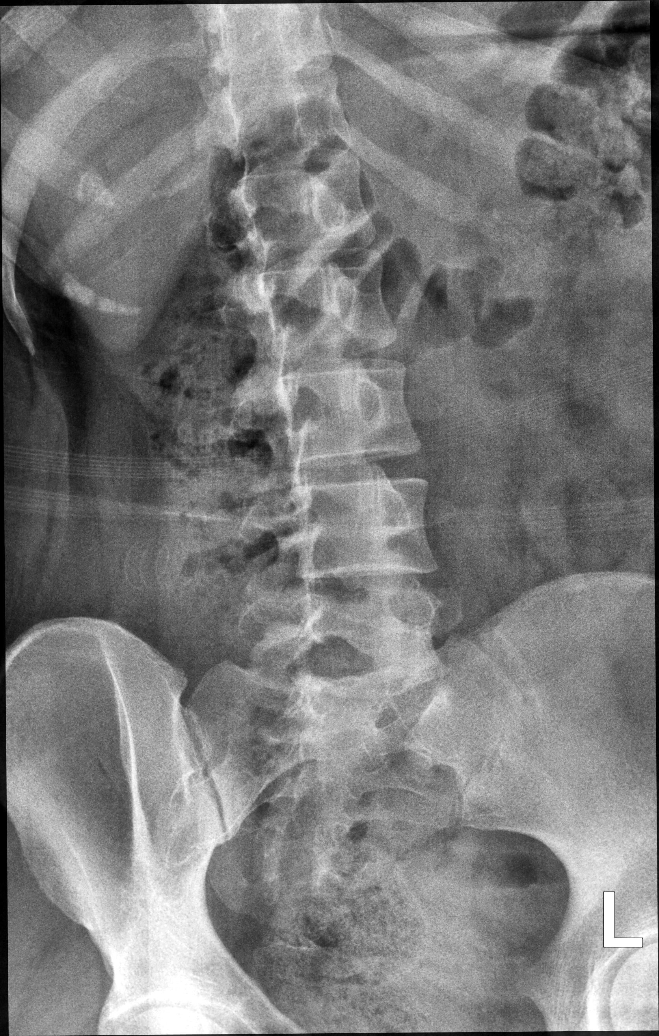

[lumbar spine lat (1 of 2)]
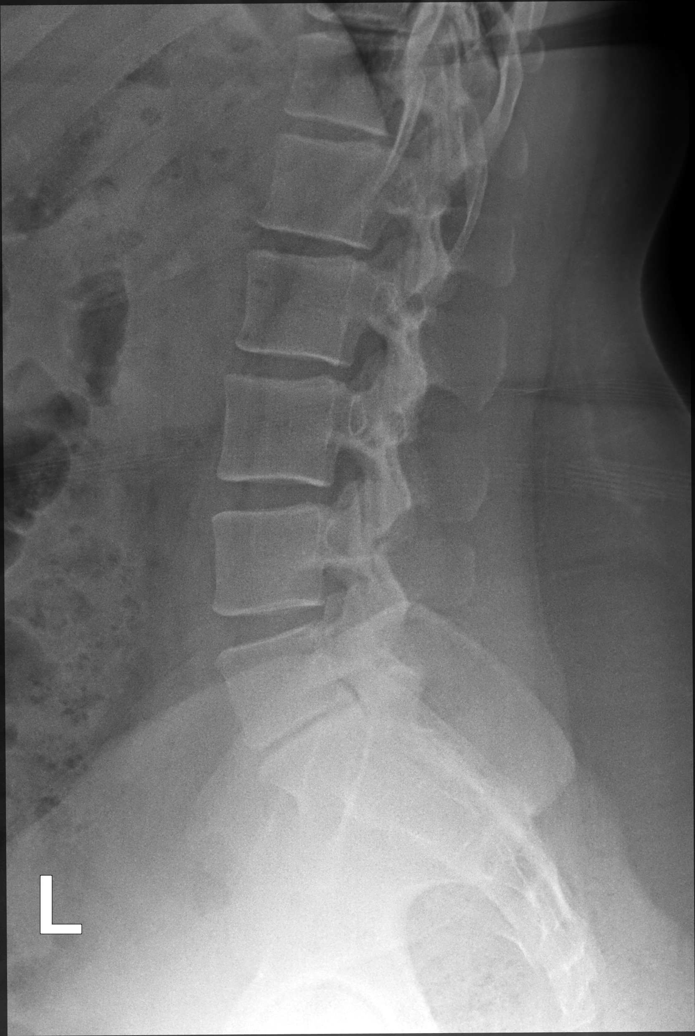

[lumbar spine lat (2 of 2)]
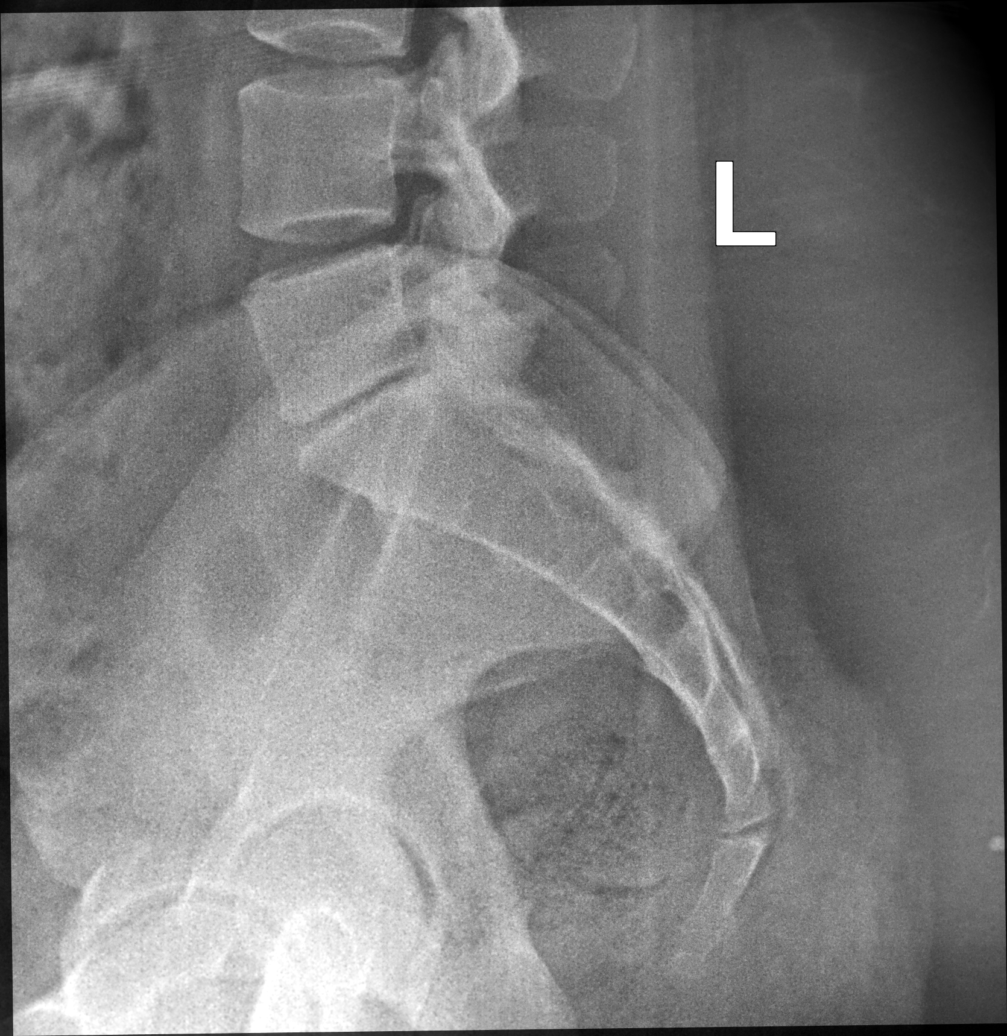

[5 of 5 positions shown; findings below may reference images not displayed]

FINDINGS: No fracture or spondylolisthesis is noted. Moderate degenerative
disc disease is noted at L5-S1. Remaining disc spaces are
unremarkable.
IMPRESSION: Moderate degenerative disc disease is noted at L5-S1. No acute
abnormality seen in the lumbar spine.

## 2022-09-09 ENCOUNTER — Encounter: Payer: Self-pay | Admitting: *Deleted

## 2022-11-28 ENCOUNTER — Encounter: Payer: Self-pay | Admitting: *Deleted
